# Patient Record
Sex: Female | Born: 1995 | Race: Black or African American | Hispanic: No | Marital: Single | Smoking: Current every day smoker
Health system: Southern US, Community
[De-identification: ages and names within clinical notes are randomized; demographics above are authoritative.]

## PROBLEM LIST (undated history)

## (undated) DIAGNOSIS — J45909 Unspecified asthma, uncomplicated: Secondary | ICD-10-CM

---
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
## 2016-09-14 IMAGING — CR DG CHEST 1V PORT
1 series · 1 of 1 positions shown · non-contrast
Comparison: None.

CLINICAL DATA: Initial evaluation for shortness of breath, history
of asthma.

EXAM:
PORTABLE CHEST - 1 VIEW

[AP]
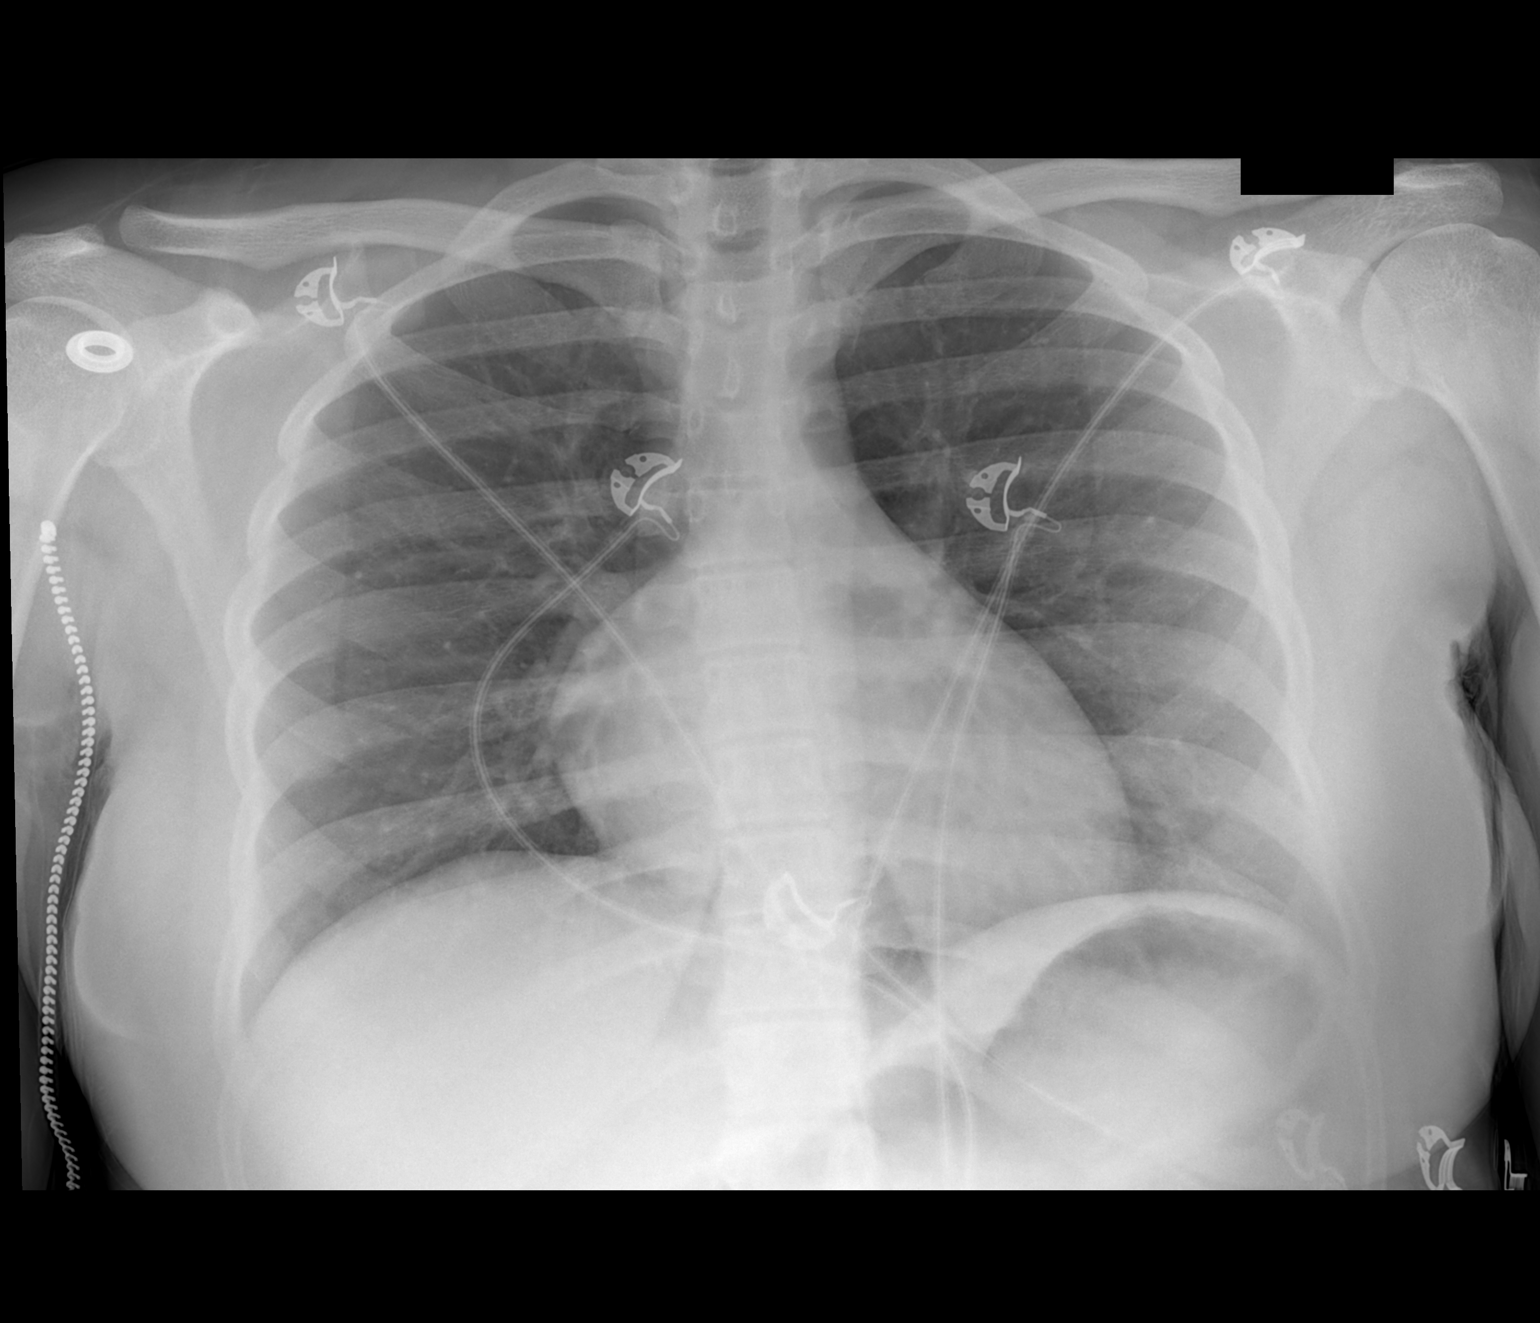

[1 of 1 positions shown; findings below may reference images not displayed]

FINDINGS: Cardiac and mediastinal silhouettes within normal limits.

Lungs are normally inflated. There is mild diffuse airway
thickening. No focal infiltrates. Mild atelectasis at the left lung
base. No pulmonary edema or pleural effusion. No pneumothorax.

No acute osseus abnormality.
IMPRESSION: Mild diffuse airway thickening, likely related to history of asthma.
No other active cardiopulmonary disease.

## 2016-11-13 ENCOUNTER — Emergency Department (HOSPITAL_BASED_OUTPATIENT_CLINIC_OR_DEPARTMENT_OTHER)
Admission: EM | Admit: 2016-11-13 | Discharge: 2016-11-13 | Disposition: A | Discharge status: Home / Self Care | Payer: No Typology Code available for payment source | Attending: Emergency Medicine | Admitting: Emergency Medicine

## 2016-11-13 ENCOUNTER — Encounter (HOSPITAL_BASED_OUTPATIENT_CLINIC_OR_DEPARTMENT_OTHER): Payer: Self-pay | Admitting: *Deleted

## 2016-11-13 DIAGNOSIS — M79645 Pain in left finger(s): Secondary | ICD-10-CM

## 2016-11-13 DIAGNOSIS — M25512 Pain in left shoulder: Secondary | ICD-10-CM | POA: Diagnosis not present

## 2016-11-13 DIAGNOSIS — F17210 Nicotine dependence, cigarettes, uncomplicated: Secondary | ICD-10-CM | POA: Insufficient documentation

## 2016-11-13 DIAGNOSIS — J45909 Unspecified asthma, uncomplicated: Secondary | ICD-10-CM | POA: Diagnosis not present

## 2016-11-13 DIAGNOSIS — M791 Myalgia: Secondary | ICD-10-CM | POA: Insufficient documentation

## 2016-11-13 DIAGNOSIS — Z79899 Other long term (current) drug therapy: Secondary | ICD-10-CM | POA: Diagnosis not present

## 2016-11-13 DIAGNOSIS — M7918 Myalgia, other site: Secondary | ICD-10-CM

## 2016-11-13 MED ORDER — IBUPROFEN 200 MG PO TABS
600.0000 mg | ORAL_TABLET | Freq: Once | ORAL | Status: AC
  Administered 2016-11-13: 600 mg via ORAL
  Filled 2016-11-13: qty 1

## 2016-11-13 MED ORDER — CYCLOBENZAPRINE HCL 10 MG PO TABS
10.0000 mg | ORAL_TABLET | Freq: Once | ORAL | Status: AC
  Administered 2016-11-13: 10 mg via ORAL
  Filled 2016-11-13: qty 1

## 2016-11-13 MED ORDER — IBUPROFEN 600 MG PO TABS: 600.0000 mg | ORAL_TABLET | Freq: Four times a day (QID) | ORAL | 0 refills | Status: AC | PRN

## 2016-11-13 MED ORDER — CYCLOBENZAPRINE HCL 10 MG PO TABS: 10.0000 mg | ORAL_TABLET | Freq: Two times a day (BID) | ORAL | 0 refills | Status: AC | PRN
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
